# Patient Record
Sex: Female | Born: 1995 | Race: White | Hispanic: No | Marital: Single | Smoking: Never smoker
Health system: Southern US, Community
[De-identification: ages and names within clinical notes are randomized; demographics above are authoritative.]

---

## 2014-11-14 ENCOUNTER — Ambulatory Visit: Admit: 2014-11-14 | Disposition: A | Discharge status: Home / Self Care | Payer: Self-pay | Admitting: Family Medicine

## 2016-01-04 ENCOUNTER — Emergency Department: Payer: PRIVATE HEALTH INSURANCE

## 2016-01-04 ENCOUNTER — Encounter: Payer: Self-pay | Admitting: Emergency Medicine

## 2016-01-04 ENCOUNTER — Emergency Department
Admission: EM | Admit: 2016-01-04 | Discharge: 2016-01-05 | Disposition: A | Discharge status: Home / Self Care | Payer: PRIVATE HEALTH INSURANCE | Attending: Emergency Medicine | Admitting: Emergency Medicine

## 2016-01-04 DIAGNOSIS — R079 Chest pain, unspecified: Secondary | ICD-10-CM | POA: Insufficient documentation

## 2016-01-04 DIAGNOSIS — R12 Heartburn: Secondary | ICD-10-CM

## 2016-01-04 LAB — CBC
HCT: 40 % (ref 35.0–47.0)
HEMOGLOBIN: 13.3 g/dL (ref 12.0–16.0)
MCH: 27.9 pg (ref 26.0–34.0)
MCHC: 33.2 g/dL (ref 32.0–36.0)
MCV: 84.1 fL (ref 80.0–100.0)
Platelets: 254 10*3/uL (ref 150–440)
RBC: 4.76 MIL/uL (ref 3.80–5.20)
RDW: 13.1 % (ref 11.5–14.5)
WBC: 9.1 10*3/uL (ref 3.6–11.0)

## 2016-01-04 LAB — BASIC METABOLIC PANEL
ANION GAP: 7 (ref 5–15)
BUN: 10 mg/dL (ref 6–20)
CALCIUM: 8.4 mg/dL — AB (ref 8.9–10.3)
CO2: 23 mmol/L (ref 22–32)
Chloride: 101 mmol/L (ref 101–111)
Creatinine, Ser: 0.65 mg/dL (ref 0.44–1.00)
GFR calc Af Amer: >60 mL/min (ref >60)
Glucose, Bld: 96 mg/dL (ref 65–99)
POTASSIUM: 3.3 mmol/L — AB (ref 3.5–5.1)
SODIUM: 131 mmol/L — AB (ref 135–145)

## 2016-01-04 LAB — TROPONIN I

## 2016-01-04 LAB — POCT PREGNANCY, URINE: PREG TEST UR: NEGATIVE

## 2016-01-04 NOTE — ED Notes (Signed)
Pt c/o intermittent chest pain. Pt is ambulatory and in no acute respiratory distress at this time. Pt able to complete sentences. Pt states hx of anxiety but denies this pain is related to her anxiety and has taken no meds for either pain or anxiety at this time.

## 2016-01-04 NOTE — ED Notes (Signed)
Patient states pain is worse when laying flat.

## 2016-01-05 LAB — TROPONIN I: Troponin I: <0.03 ng/mL (ref <0.031)

## 2016-01-05 MED ORDER — FAMOTIDINE 40 MG PO TABS: 40.0000 mg | ORAL_TABLET | Freq: Every evening | ORAL | Status: AC

## 2016-01-05 MED ORDER — GI COCKTAIL ~~LOC~~
30.0000 mL | Freq: Once | ORAL | Status: AC
  Administered 2016-01-05: 30 mL via ORAL
  Filled 2016-01-05: qty 30

## 2016-01-05 NOTE — Discharge Instructions (Signed)
Heartburn Heartburn is a type of pain or discomfort that can happen in the throat or chest. It is often described as a burning pain. It may also cause a bad taste in the mouth. Heartburn may feel worse when you lie down or bend over, and it is often worse at night. Heartburn may be caused by stomach contents that move back up into the esophagus (reflux). HOME CARE INSTRUCTIONS Take these actions to decrease your discomfort and to help avoid complications. Diet  Follow a diet as recommended by your health care provider. This may involve avoiding foods and drinks such as:  Coffee and tea (with or without caffeine).  Drinks that contain alcohol.  Energy drinks and sports drinks.  Carbonated drinks or sodas.  Chocolate and cocoa.  Peppermint and mint flavorings.  Garlic and onions.  Horseradish.  Spicy and acidic foods, including peppers, chili powder, curry powder, vinegar, hot sauces, and barbecue sauce.  Citrus fruit juices and citrus fruits, such as oranges, lemons, and limes.  Tomato-based foods, such as red sauce, chili, salsa, and pizza with red sauce.  Fried and fatty foods, such as donuts, french fries, potato chips, and high-fat dressings.  High-fat meats, such as hot dogs and fatty cuts of red and [place] meats, such as rib eye steak, sausage, ham, and bacon.  High-fat dairy items, such as whole milk, butter, and cream cheese.  Eat small, frequent meals instead of large meals.  Avoid drinking large amounts of liquid with your meals.  Avoid eating meals during the 2-3 hours before bedtime.  Avoid lying down right after you eat.  Do not exercise right after you eat. General Instructions  Pay attention to any changes in your symptoms.  Take over-the-counter and prescription medicines only as told by your health care provider. Do not take aspirin, ibuprofen, or other NSAIDs unless your health care provider told you to do so.  Do not use any tobacco products,  including cigarettes, chewing tobacco, and e-cigarettes. If you need help quitting, ask your health care provider.  Wear loose-fitting clothing. Do not wear anything tight around your waist that causes pressure on your abdomen.  Raise (elevate) the head of your bed about 6 inches (15 cm).  Try to reduce your stress, such as with yoga or meditation. If you need help reducing stress, ask your health care provider.  If you are overweight, reduce your weight to an amount that is healthy for you. Ask your health care provider for guidance about a safe weight loss goal.  Keep all follow-up visits as told by your health care provider. This is important. SEEK MEDICAL CARE IF:  You have new symptoms.  You have unexplained weight loss.  You have difficulty swallowing, or it hurts to swallow.  You have wheezing or a persistent cough.  Your symptoms do not improve with treatment.  You have frequent heartburn for more than two weeks. SEEK IMMEDIATE MEDICAL CARE IF:  You have pain in your arms, neck, jaw, teeth, or back.  You feel sweaty, dizzy, or light-headed.  You have chest pain or shortness of breath.  You vomit and your vomit looks like blood or coffee grounds.  Your stool is bloody or black.   This information is not intended to replace advice given to you by your health care provider. Make sure you discuss any questions you have with your health care provider.   Document Released: 02/01/2009 Document Revised: 06/06/2015 Document Reviewed: 01/10/2015 Elsevier Interactive Patient Education [employer].  Nonspecific Chest Pain It is often hard to find the cause of chest pain. There is always a chance that your pain could be related to something serious, such as a heart attack or a blood clot in your lungs. Chest pain can also be caused by conditions that are not life-threatening. If you have chest pain, it is very important to follow up with your doctor.  HOME CARE  If you  were prescribed an antibiotic medicine, finish it all even if you start to feel better.  Avoid any activities that cause chest pain.  Do not use any tobacco products, including cigarettes, chewing tobacco, or electronic cigarettes. If you need help quitting, ask your doctor.  Do not drink alcohol.  Take medicines only as told by your doctor.  Keep all follow-up visits as told by your doctor. This is important. This includes any further testing if your chest pain does not go away.  Your doctor may tell you to keep your head raised (elevated) while you sleep.  Make lifestyle changes as told by your doctor. These may include:  Getting regular exercise. Ask your doctor to suggest some activities that are safe for you.  Eating a heart-healthy diet. Your doctor or a diet specialist (dietitian) can help you to learn healthy eating options.  Maintaining a healthy weight.  Managing diabetes, if necessary.  Reducing stress. GET HELP IF:  Your chest pain does not go away, even after treatment.  You have a rash with blisters on your chest.  You have a fever. GET HELP RIGHT AWAY IF:  Your chest pain is worse.  You have an increasing cough, or you cough up blood.  You have severe belly (abdominal) pain.  You feel extremely weak.  You pass out (faint).  You have chills.  You have sudden, unexplained chest discomfort.  You have sudden, unexplained discomfort in your arms, back, neck, or jaw.  You have shortness of breath at any time.  You suddenly start to sweat, or your skin gets clammy.  You feel nauseous.  You vomit.  You suddenly feel light-headed or dizzy.  Your heart begins to beat quickly, or it feels like it is skipping beats. These symptoms may be an emergency. Do not wait to see if the symptoms will go away. Get medical help right away. Call your local emergency services (911 in the [place] Do not drive yourself to the hospital.   This information is not  intended to replace advice given to you by your health care provider. Make sure you discuss any questions you have with your health care provider.   Document Released: 03/03/2008 Document Revised: 10/06/2014 Document Reviewed: 04/21/2014 Elsevier Interactive Patient Education [employer] Inc[employer]

## 2016-01-05 NOTE — ED Provider Notes (Signed)
[facility] Emergency Department Provider Note  ____________________________________________  Time seen: Approximately 2355 PM  I have reviewed the triage vital signs and the nursing notes.   HISTORY  Chief Complaint Chest Pain    HPI Jasmine Williamson is a 20 y.o. female who comes into the hospital today with some chest pain. The patient reports that she woke up in the middle the night with some chest pain. She reports that it is burning in the middle of her chest. She reports is worse after eating as well as when she lays down and swallows. The patient reports she didn't think much of it but it seemed to get worse during the day. The patient has never had heartburn before and is unsure if that's what it is. The patient's mom informed her to take some Prilosec which she took around 6 PM but she reports that she laid down again tonight and the pain seemed to get worse. She denies having any spicy foods or tomato sauce or any citric or acidic drinks. She reports that currently her pain is a 2 out of 10 intensity. The patient denies any dizziness, sweats, shortness of breath nausea or vomiting. The patient reports apparently her to come in and get checked out.   History reviewed. No pertinent past medical history.  There are no active problems to display for this patient.   History reviewed. No pertinent past surgical history.  Current Outpatient Rx  Name  Route  Sig  Dispense  Refill  . escitalopram (LEXAPRO) 10 MG tablet   Oral   Take 10 mg by mouth daily.         . famotidine (PEPCID) 40 MG tablet   Oral   Take 1 tablet (40 mg total) by mouth every evening.   20 tablet   0     Allergies Augmentin  No family history on file.  Social History Social History  Substance Use Topics  . Smoking status: Never Smoker   . Smokeless tobacco: None  . Alcohol Use: No    Review of Systems Constitutional: No fever/chills Eyes: No visual changes. ENT: No  sore throat. Cardiovascular:  chest pain. Respiratory: Denies shortness of breath. Gastrointestinal: No abdominal pain.  No nausea, no vomiting.  No diarrhea.  No constipation. Genitourinary: Negative for dysuria. Musculoskeletal: Negative for back pain. Skin: Negative for rash. Neurological: Negative for headaches, focal weakness or numbness.  10-point ROS otherwise negative.  ____________________________________________   PHYSICAL EXAM:  VITAL SIGNS: ED Triage Vitals  Enc Vitals Group     BP 01/04/16 2155 126/76 mmHg     Pulse Rate 01/04/16 2155 90     Resp 01/04/16 2155 16     Temp 01/04/16 2155 98.6 F (37 C)     Temp Source 01/04/16 2155 Oral     SpO2 01/04/16 2155 98 %     Weight 01/04/16 2155 135 lb (61.236 kg)     Height 01/04/16 2155 5\' 5"  (1.651 m)     Head Cir --      Peak Flow --      Pain Score 01/04/16 2149 4     Pain Loc --      Pain Edu? --      Excl. in GC? --     Constitutional: Alert and oriented. Well appearing and in mild distress. Eyes: Conjunctivae are normal. PERRL. EOMI. Head: Atraumatic. Nose: No congestion/rhinnorhea. Mouth/Throat: Mucous membranes are moist.  Oropharynx non-erythematous. Cardiovascular: Normal rate, regular rhythm. Grossly  normal heart sounds.  Good peripheral circulation. Respiratory: Normal respiratory effort.  No retractions. Lungs CTAB. Gastrointestinal: Soft and nontender. No distention. Positive bowel sounds Musculoskeletal: No lower extremity tenderness nor edema.   Neurologic:  Normal speech and language.  Skin:  Skin is warm, dry and intact. Psychiatric: Mood and affect are normal.   ____________________________________________   LABS (all labs ordered are listed, but only abnormal results are displayed)  Labs Reviewed  BASIC METABOLIC PANEL - Abnormal; Notable for the following:    Sodium 131 (*)    Potassium 3.3 (*)    Calcium 8.4 (*)    All other components within normal limits  CBC  TROPONIN I   TROPONIN I  POC URINE PREG, ED  POCT PREGNANCY, URINE   ____________________________________________  EKG  ED ECG REPORT I, Rebecka Apley, the attending physician, personally viewed and interpreted this ECG.   Date: 01/04/2016  EKG Time: 2153  Rate: 94  Rhythm: normal sinus rhythm  Axis: normal  Intervals:none  ST&T Change: Flipped T waves in leads V3  ____________________________________________  RADIOLOGY  CXR: No acute pulmonary process, scoliosis ____________________________________________   PROCEDURES  Procedure(s) performed: None  Critical Care performed: No  ____________________________________________   INITIAL IMPRESSION / ASSESSMENT AND PLAN / ED COURSE  Pertinent labs & imaging results that were available during my care of the patient were reviewed by me and considered in my medical decision making (see chart for details).  This is a 20 year old female who comes into the hospital today with some burning chest pain. The patient reports that she's been having the symptoms all week. The patient denies any pleuritic chest pain. I'll give her GI cocktail and repeat her troponin. The patient at this time is in minimal distress.  The patient's repeat troponin is negative. She'll be discharged home to follow-up with the acute care clinic for further evaluation of her symptoms. ____________________________________________   FINAL CLINICAL IMPRESSION(S) / ED DIAGNOSES  Final diagnoses:  Chest pain, unspecified chest pain type  Heart burn      Rebecka Apley, MD 01/05/16 0121

## 2017-03-16 IMAGING — CR DG CHEST 2V
1 series · 2 of 2 positions shown · non-contrast
Comparison: None.

CLINICAL DATA: Intermittent chest pain today.

EXAM:
CHEST  2 VIEW

[Series 1: dg chest 2 view · 0.14mm/px · 2 of 2 slices shown]
[im 1/2]
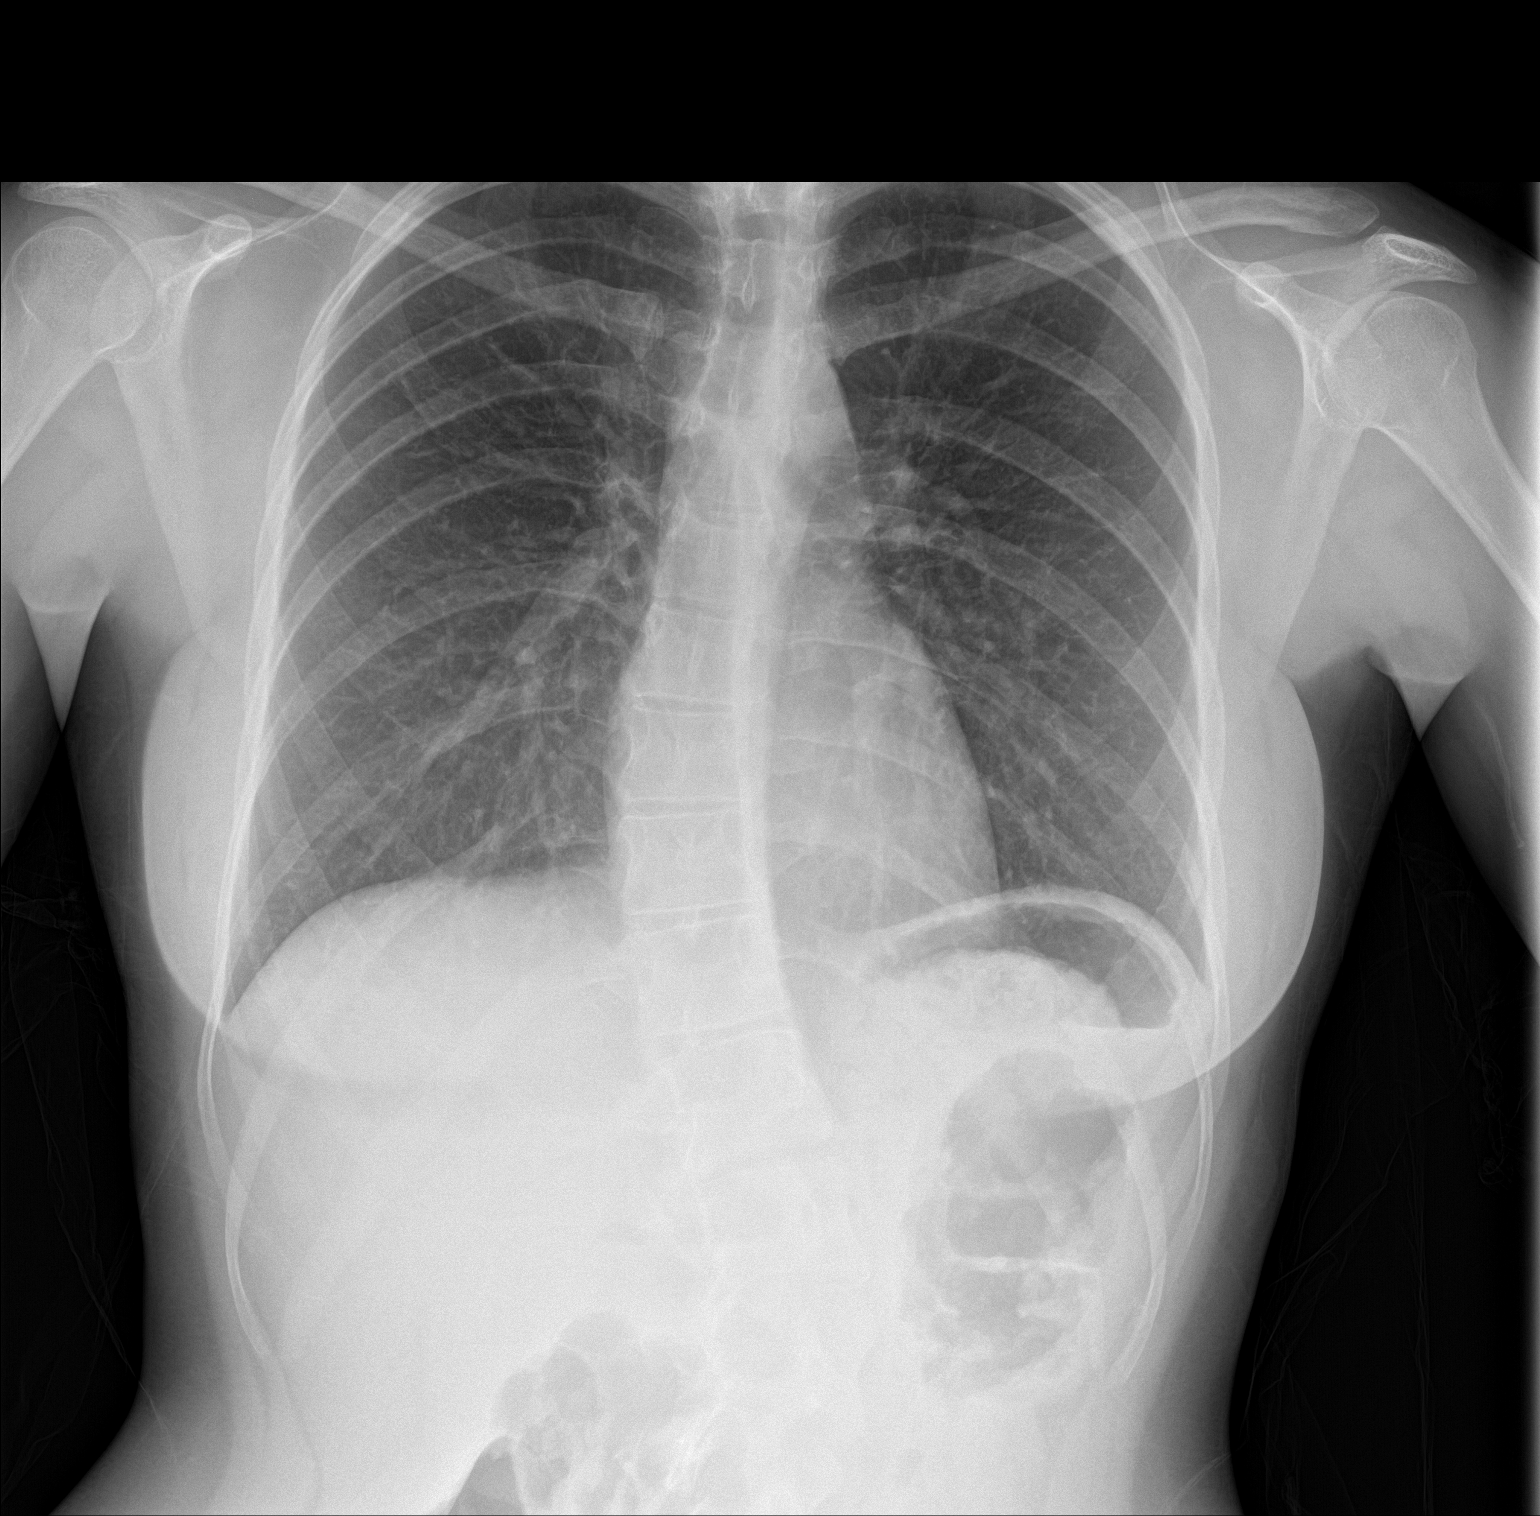
[im 2/2]
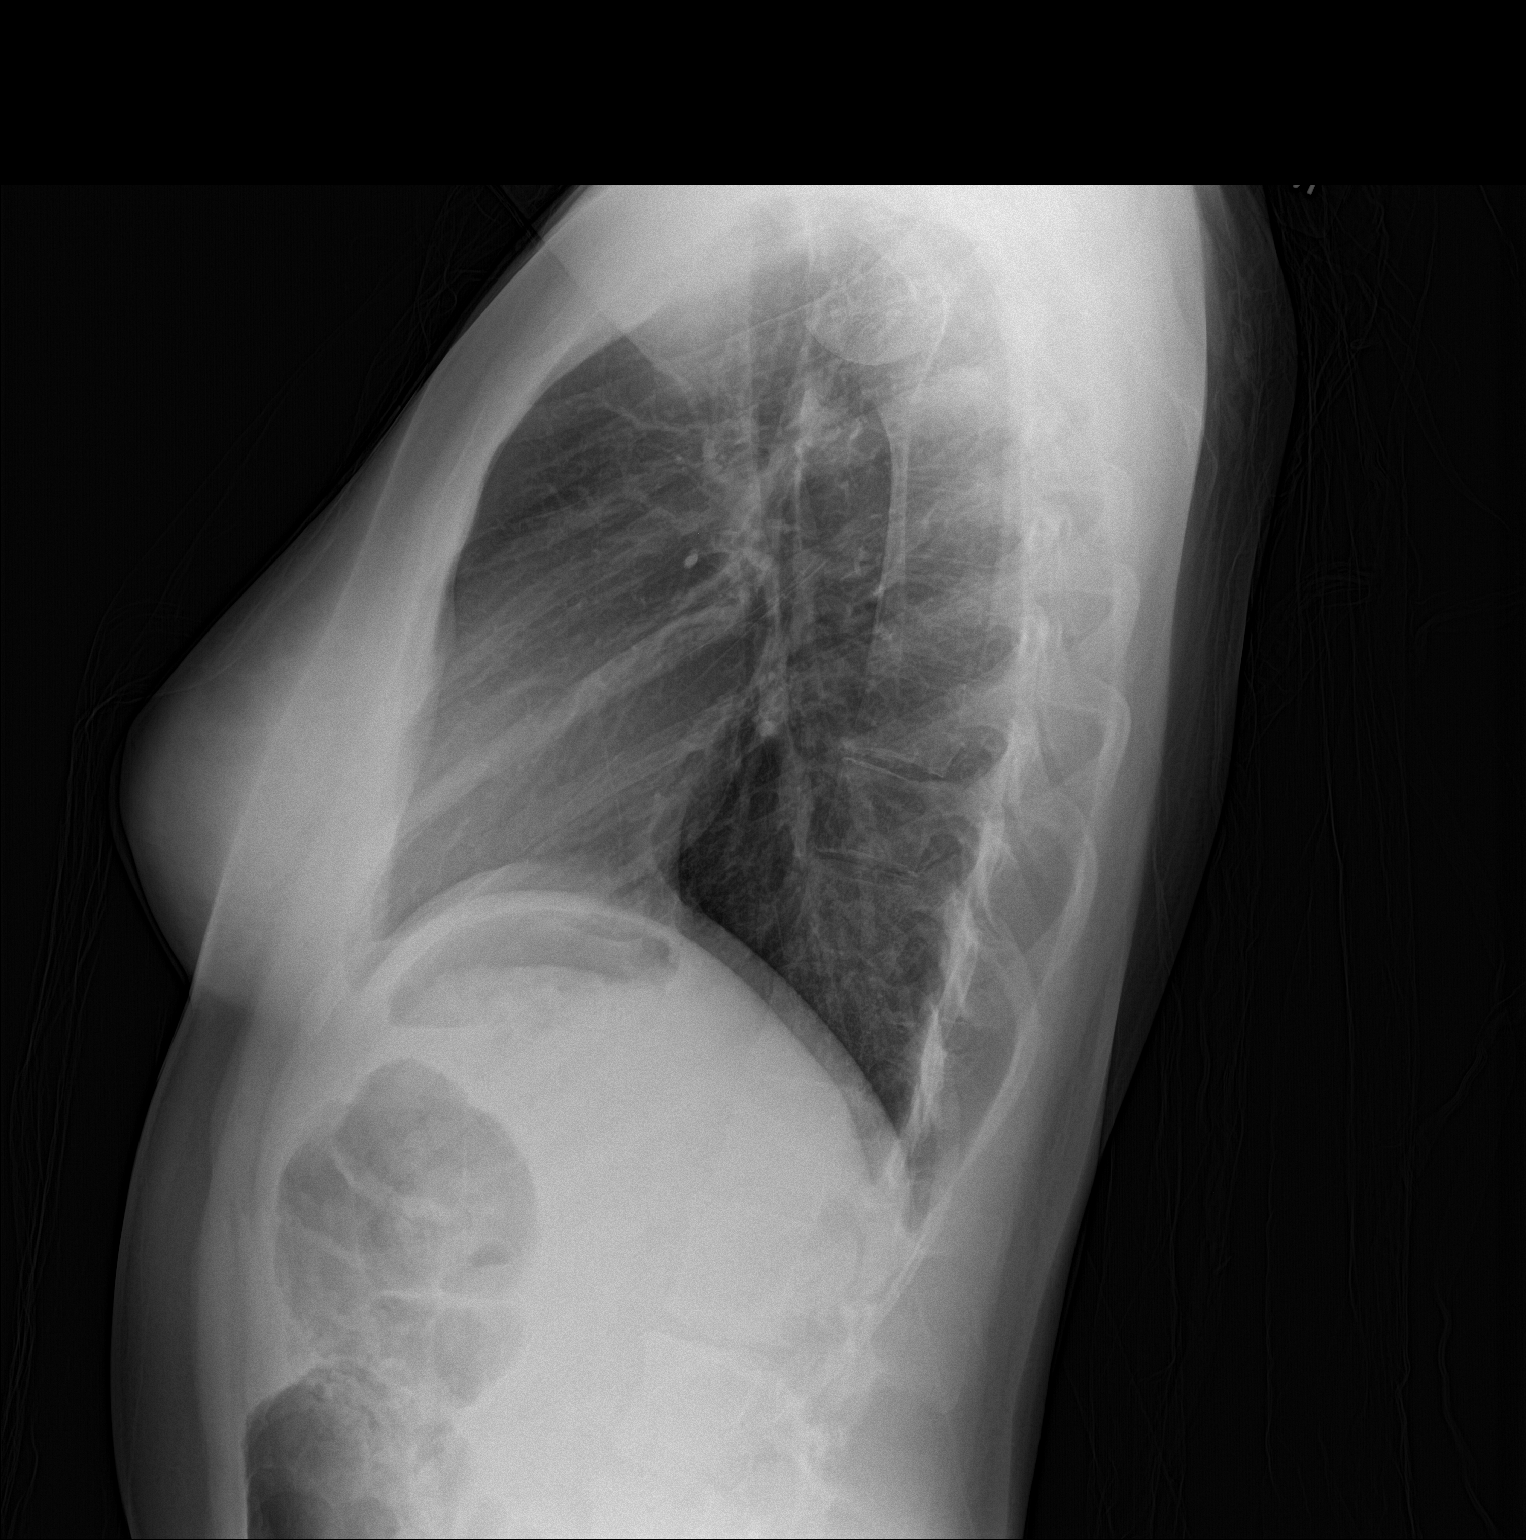

[2 of 2 positions shown; findings below may reference images not displayed]

FINDINGS: The cardiomediastinal contours are normal. The lungs are clear.
Pulmonary vasculature is normal. No consolidation, pleural effusion,
or pneumothorax. No acute osseous abnormalities are seen. There is
broad-based S-shaped scoliotic curvature of the spine.
IMPRESSION: No acute pulmonary process.

Scoliosis.
